# Patient Record
Sex: Female | Born: 1960 | Race: Black or African American | Hispanic: No | Marital: Married | Smoking: Never smoker
Health system: Southern US, Community
[De-identification: ages and names within clinical notes are randomized; demographics above are authoritative.]

---

## 2011-11-10 DIAGNOSIS — M797 Fibromyalgia: Secondary | ICD-10-CM | POA: Insufficient documentation

## 2011-11-10 DIAGNOSIS — E669 Obesity, unspecified: Secondary | ICD-10-CM | POA: Insufficient documentation

## 2016-02-16 ENCOUNTER — Encounter: Payer: Self-pay | Admitting: Internal Medicine

## 2016-02-16 ENCOUNTER — Ambulatory Visit (INDEPENDENT_AMBULATORY_CARE_PROVIDER_SITE_OTHER): Admitting: Internal Medicine

## 2016-02-16 VITALS — BP 128/79 | HR 74 | Temp 98.2°F | Resp 16 | Ht 65.0 in | Wt 185.0 lb

## 2016-02-16 DIAGNOSIS — Z9109 Other allergy status, other than to drugs and biological substances: Secondary | ICD-10-CM | POA: Insufficient documentation

## 2016-02-16 DIAGNOSIS — S4351XA Sprain of right acromioclavicular joint, initial encounter: Secondary | ICD-10-CM

## 2016-02-16 DIAGNOSIS — M25511 Pain in right shoulder: Secondary | ICD-10-CM

## 2016-02-16 MED ORDER — CYCLOBENZAPRINE HCL 10 MG PO TABS: 10.0000 mg | ORAL_TABLET | Freq: Every day | ORAL | Status: DC

## 2016-02-16 NOTE — Patient Instructions (Signed)
Recheck 1 week?

## 2016-02-16 NOTE — Progress Notes (Signed)
Subjective:  By signing my name below, I, Stann Oresung-Kai Tsai, attest that this documentation has been prepared under the direction and in the presence of Ellamae Siaobert Doolittle, MD. Electronically Signed: Stann Oresung-Kai Tsai, Scribe. 02/16/2016 , 4:16 PM .  Patient was seen in Room 14 .   Patient ID: Jaclyn Kaiser, female    DOB: 05/01/1961, 55 y.o.   MRN: [id number] Chief Complaint  Patient presents with  . Shoulder Injury    C/O right shoulder pain since around 9 this morning   HPI Jaclyn MemoryLoretta Poythress is a 55 y.o. female who [facility] complaining of right shoulder pain that's been present since 9:00AM today. She was bending over to pick up a heavy box while at work. She felt something pull in her right shoulder immediately after it happened. She continued to keep working and felt heat coming from the area.   Allergies  Allergen Reactions  . Naproxen Sodium Swelling   Patient Active Problem List   Diagnosis Date Noted  . 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 Fibrositis 11/10/2011  . 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  albuterol (PROVENTIL HFA;VENTOLIN HFA) 108 (90 Base) MCG/ACT inhaler, Inhale into the lungs., Disp: , Rfl:  .  Cholecalciferol (VITAMIN D3) 1000 units CAPS, Take by mouth., Disp: , Rfl:  .  fluticasone (FLONASE) 50 MCG/ACT nasal spray, Place into the nose., Disp: , Rfl:  .  ipratropium (ATROVENT) 0.06 % nasal spray, Place into the nose., Disp: , Rfl:  .  Loratadine 10 MG CAPS, Take by mouth., Disp: , Rfl:  .  Multiple Vitamins-Minerals (HAIR/SKIN/NAILS/BIOTIN) TABS, Take by mouth., Disp: , Rfl:  .  DUEXIS 800-26.6 MG TABS, Take 1 tablet by mouth every 8 (eight) hours., Disp: , Rfl: 0 .  gabapentin (NEURONTIN) 300 MG capsule, , Disp: , Rfl:   Review of Systems  Constitutional: Negative for fever, chills and fatigue.  Musculoskeletal: Positive for myalgias, joint  swelling and arthralgias. Negative for back pain, gait problem, neck pain and neck stiffness.  Skin: Negative for rash and wound.  Neurological: Negative for dizziness, weakness, numbness and headaches.      Objective:   Physical Exam  Constitutional: She is oriented to person, place, and time. She appears well-developed and well-nourished. No distress.  HENT:  Head: Normocephalic and atraumatic.  Eyes: EOM are normal. Pupils are equal, round, and reactive to light.  Neck: Neck supple.  Neck rom good without pain  Cardiovascular: Normal rate.   Pulmonary/Chest: Effort normal. No respiratory distress.  Musculoskeletal: Normal range of motion.  Right shoulder: not swollen but has tender over anterior shoulder including the AC joint and bicipital groove; biceps intact, there is clipping over AC joint with ROM, and has anterior shoulder pain with abduction, adduction, external rotation and anterior elevation, all resisted motions hurt anteriorally Trapezius intact Scapula moves freely R elbow intact No neuromuscular changes in R hand  Neurological: She is alert and oriented to person, place, and time.  Skin: Skin is warm and dry.  Psychiatric: She has a normal mood and affect. Her behavior is normal.  Nursing note and vitals reviewed.  BP 128/79 mmHg  Pulse 74  Temp(Src) 98.2 F (36.8 C) (Oral)  Resp 16  Ht 5\' 5"  (1.651 m)  Wt 185 lb (83.915 kg)  BMI 30.79 kg/m2  SpO2 98%    Assessment & Plan:  I have completed the patient encounter in  its entirety as documented by the scribe, with editing by me where necessary. Robert P. Merla Riches, M.D.  Acromioclavicular sprain, right, initial encounter  Pain in joint of right shoulder  Ok for regular meds Add flex at hs Arm in sling Ice 52m bid No use at work til recheck 1 week

## 2016-02-23 ENCOUNTER — Ambulatory Visit

## 2016-02-23 ENCOUNTER — Ambulatory Visit (INDEPENDENT_AMBULATORY_CARE_PROVIDER_SITE_OTHER): Admitting: Physician Assistant

## 2016-02-23 VITALS — BP 120/74 | HR 101 | Temp 97.9°F | Resp 14 | Ht 64.0 in | Wt 186.0 lb

## 2016-02-23 DIAGNOSIS — S4351XD Sprain of right acromioclavicular joint, subsequent encounter: Secondary | ICD-10-CM

## 2016-02-23 DIAGNOSIS — M6248 Contracture of muscle, other site: Secondary | ICD-10-CM

## 2016-02-23 DIAGNOSIS — S4351XA Sprain of right acromioclavicular joint, initial encounter: Secondary | ICD-10-CM

## 2016-02-23 DIAGNOSIS — M25511 Pain in right shoulder: Secondary | ICD-10-CM | POA: Diagnosis not present

## 2016-02-23 DIAGNOSIS — M62838 Other muscle spasm: Secondary | ICD-10-CM

## 2016-02-23 MED ORDER — DICLOFENAC SODIUM 75 MG PO TBEC: 75.0000 mg | DELAYED_RELEASE_TABLET | Freq: Two times a day (BID) | ORAL | Status: AC

## 2016-02-23 MED ORDER — CYCLOBENZAPRINE HCL 5 MG PO TABS: 5.0000 mg | ORAL_TABLET | Freq: Three times a day (TID) | ORAL | Status: AC | PRN

## 2016-02-23 NOTE — Patient Instructions (Signed)
     IF you received an x-ray today, you will receive an invoice from [facility]. Please contact  Radiology at [phone] with questions or concerns regarding your invoice.   IF you received labwork today, you will receive an invoice from [facility]. Please contact [facility] at [phone] with questions or concerns regarding your invoice.   Our billing staff will not be able to assist you with questions regarding bills from these companies.  You will be contacted with the lab results as soon as they are available. The fastest way to get your results is to activate your My Chart account. Instructions are located on the last page of this paperwork. If you have not heard from us regarding the results in 2 weeks, please contact this office.      

## 2016-02-23 NOTE — Progress Notes (Signed)
MRN: [id number] DOB: 09/04/1961  Subjective:  Pt presents to clinic with an injury that occurred at work on 02/16/2016 while she was lifting a heavy box. She felt a pop and tear sensation while picking up the box.  She has been wearing the sling and she feels like it is worse since she was seen last.  She is having a sharp pain in the front of her shoulder and now she feels like there is a burning pain in the back of her shoulder and tight feeling down her arm and towards her neck.  She is having some more problems with her carpal tunnel over the last week also.  She has been using the flexeril but she does not feel like it is helping.  She is taking it at night as directed.  She is not on NSAID at this time - she takes norco from her Ortho but does not feel like that is helping.  She is not sleeping well because of the pain.  Review of Systems  Musculoskeletal: Negative for joint swelling and neck pain.    Objective:  BP 120/74 mmHg  Pulse 101  Temp(Src) 97.9 F (36.6 C)  Resp 14  Ht 5\' 4"  (1.626 m)  Wt 186 lb (84.369 kg)  BMI 31.91 kg/m2  SpO2 98%  Physical Exam  Constitutional: She is oriented to person, place, and time and well-developed, well-nourished, and in no distress.  HENT:  Head: Normocephalic and atraumatic.  Right Ear: Hearing and external ear normal.  Left Ear: Hearing and external ear normal.  Eyes: Conjunctivae are normal.  Neck: Normal range of motion.  Pulmonary/Chest: Effort normal.  Musculoskeletal:       Right shoulder: She exhibits decreased range of motion (decerase abduction, external rotation and internal rotation), tenderness, bony tenderness (over AC joint), pain and spasm (trapezius muscle spasm). She exhibits no swelling, no deformity and no laceration.       Left shoulder: Normal.  Cross over with the right arm was very painful [facility] the [facility] and she has increase pain with abduction with cross over  Neurological: She is alert and oriented to person,  place, and time. Gait normal.  Skin: Skin is warm and dry.  Psychiatric: Mood, memory, affect and judgment normal.  Vitals reviewed.  Dg Shoulder Right  02/23/2016  CLINICAL DATA:  One week of shoulder pain after heavy lifting. EXAM: RIGHT SHOULDER - 2+ VIEW COMPARISON:  None. FINDINGS: No fracture or dislocation. IMPRESSION: Negative. Electronically Signed   By: Gerome Samavid  Williams III M.D   On: 02/23/2016 14:26   Dg Ac Joints  02/23/2016  CLINICAL DATA:  Shoulder pain after lifting heavy boxes. EXAM: LEFT ACROMIOCLAVICULAR JOINTS COMPARISON:  None. FINDINGS: Coracoclavicular and acromioclavicular distances are preserved. No evidence of fracture. IMPRESSION: No evidence for shoulder separation. Electronically Signed   By: Kennith CenterEric  Mansell M.D.   On: 02/23/2016 14:27    Assessment and Plan :  Right shoulder pain - Plan: DG Shoulder Right, diclofenac (VOLTAREN) 75 MG EC tablet, Care order/instruction  Acromioclavicular sprain, right, initial encounter - Plan: DG AC Joints, Ambulatory referral to Orthopedic Surgery  Trapezius muscle spasm - Plan: cyclobenzaprine (FLEXERIL) 5 MG tablet, Ambulatory referral to Orthopedic Surgery  Pt is worsening from her last visit and she has been doing as instructed.  I think at this point seeing ortho is warranted.   She will continue sling and use the new medications.   Benny LennertSarah Parnika Tweten PA-C  [facility] 02/23/2016 3:07 PM

## 2016-03-11 ENCOUNTER — Telehealth: Payer: Self-pay

## 2016-03-11 DIAGNOSIS — M25511 Pain in right shoulder: Secondary | ICD-10-CM

## 2016-03-11 DIAGNOSIS — S46911D Strain of unspecified muscle, fascia and tendon at shoulder and upper arm level, right arm, subsequent encounter: Secondary | ICD-10-CM

## 2016-03-11 NOTE — Telephone Encounter (Signed)
Pt would like to talk with someone about getting an MRI -she states that the meds are not working and feels the injection probably wont work either  [employer] [phone]

## 2016-03-13 NOTE — Telephone Encounter (Signed)
[name], did you want to put in the referral for ortho or send for MRI?

## 2016-03-14 NOTE — Telephone Encounter (Signed)
I did the referral that way ortho can decide what is best.

## 2016-03-14 NOTE — Telephone Encounter (Signed)
Pt advised.

## 2017-07-31 IMAGING — CR DG AC JOINTS*L*
2 series · 2 of 2 positions shown · non-contrast
Comparison: None.

CLINICAL DATA: Shoulder pain after lifting heavy boxes.

EXAM:
LEFT ACROMIOCLAVICULAR JOINTS

[AP (1 of 2)]
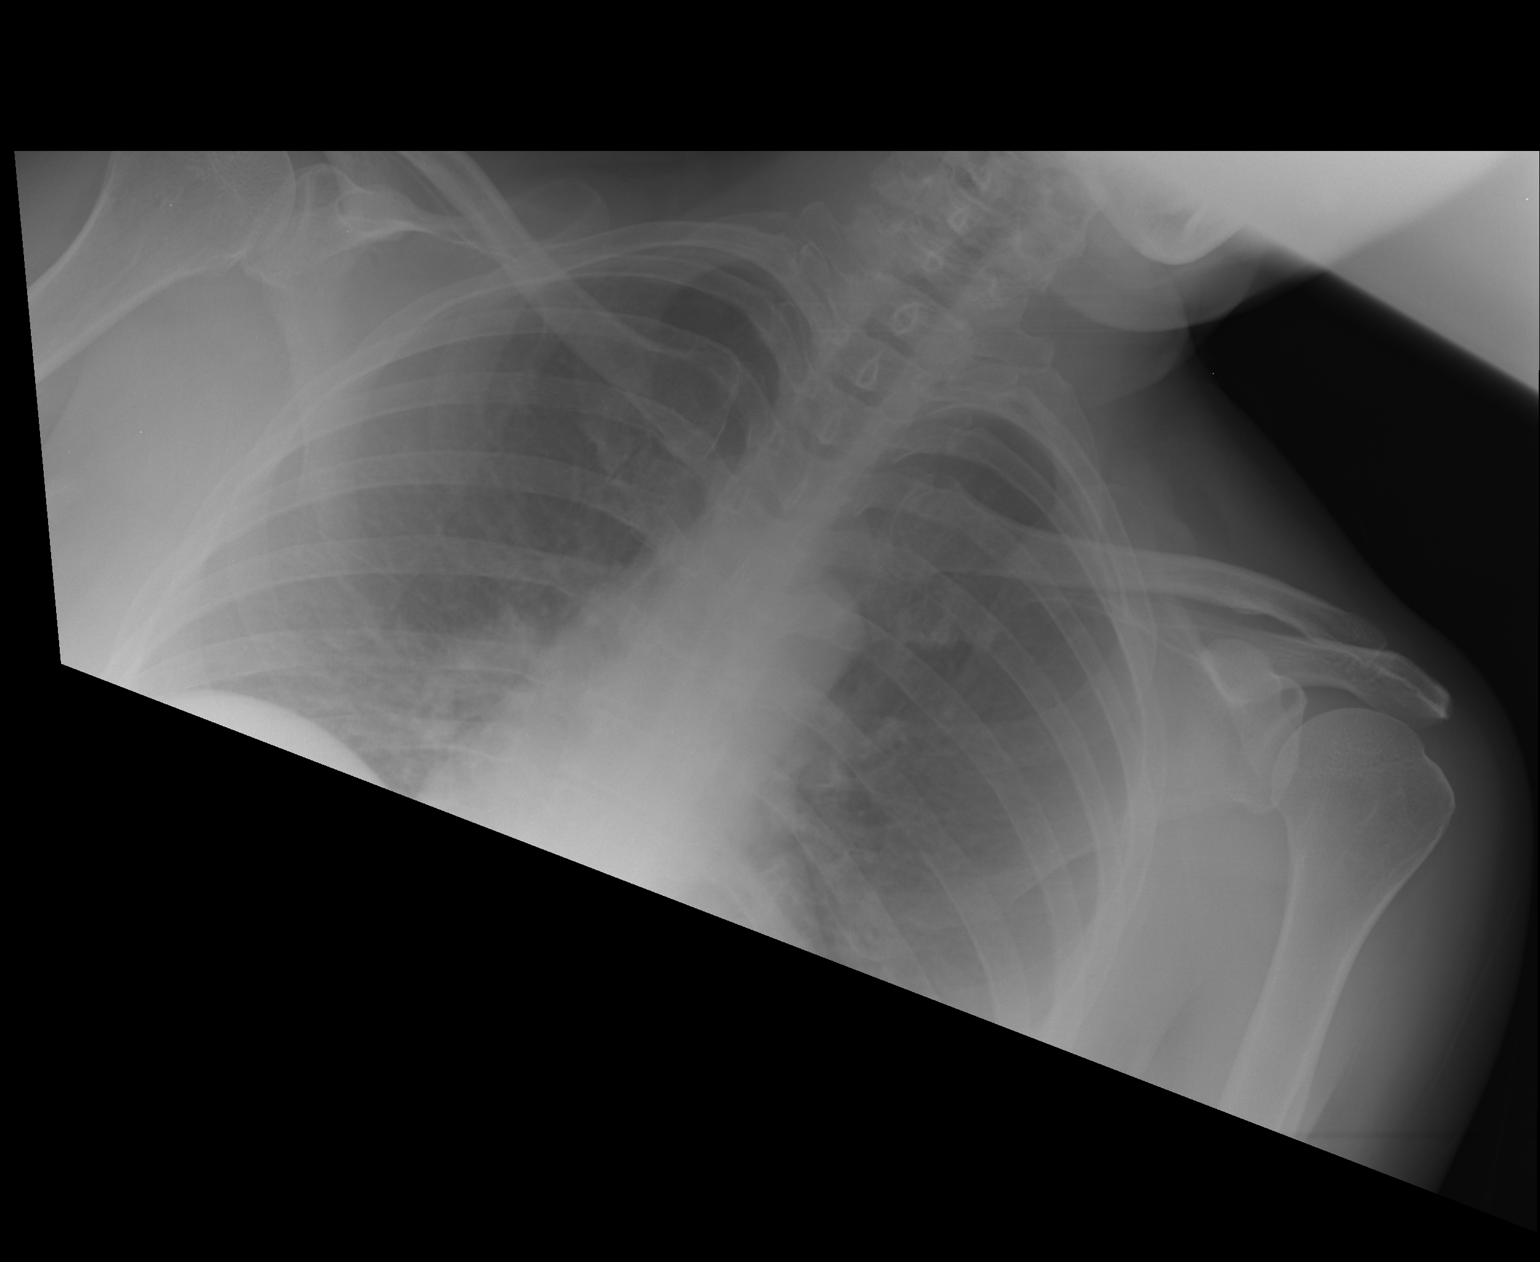

[AP (2 of 2)]
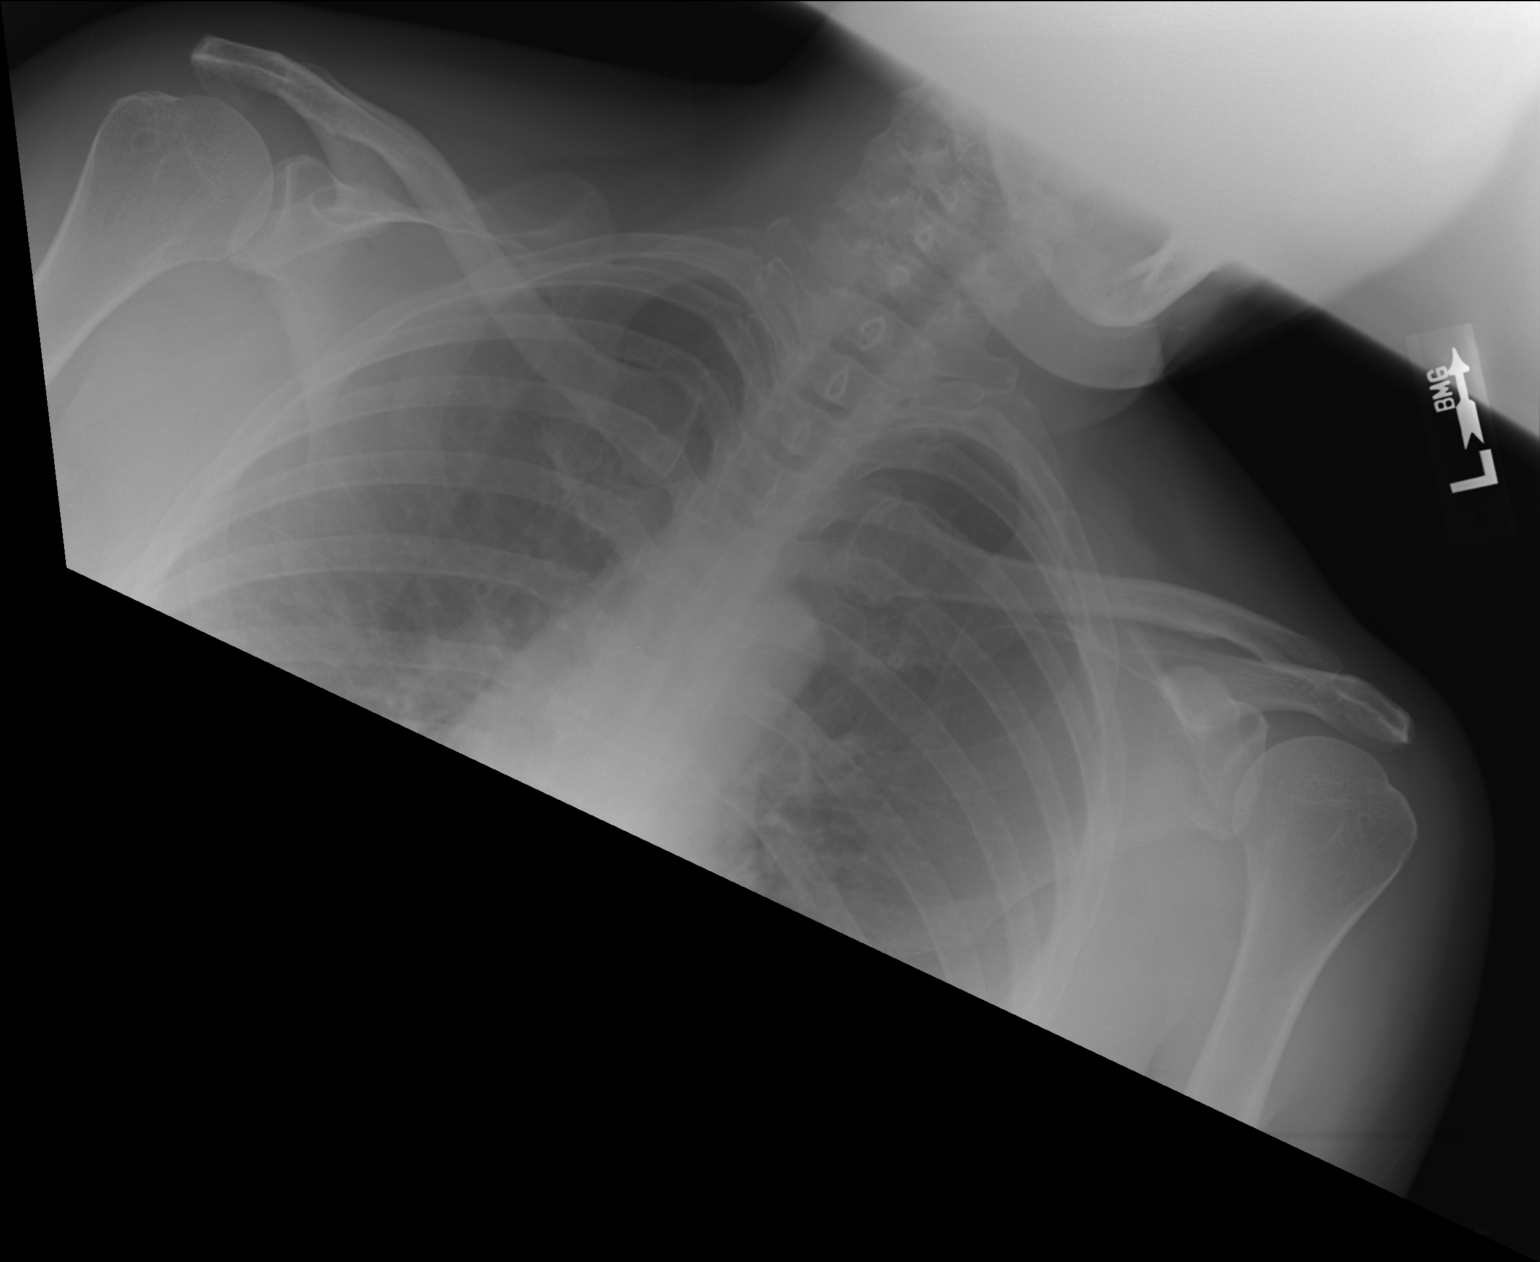

[2 of 2 positions shown; findings below may reference images not displayed]

FINDINGS: Coracoclavicular and acromioclavicular distances are preserved. No
evidence of fracture.
IMPRESSION: No evidence for shoulder separation.
